# Patient Record
Sex: Male | Born: 1955 | Race: White | Hispanic: No | Marital: Single | Smoking: Never smoker
Health system: Southern US, Community
[De-identification: ages and names within clinical notes are randomized; demographics above are authoritative.]

## PROBLEM LIST (undated history)

## (undated) DIAGNOSIS — M797 Fibromyalgia: Secondary | ICD-10-CM

## (undated) HISTORY — PX: HERNIA REPAIR: SHX51

## (undated) HISTORY — PX: TONSILLECTOMY: SUR1361

## (undated) HISTORY — PX: CHOLECYSTECTOMY: SHX55

---

## 2008-11-07 ENCOUNTER — Encounter: Admission: RE | Admit: 2008-11-07 | Discharge: 2008-11-07 | Payer: Self-pay | Admitting: Otolaryngology

## 2010-03-18 ENCOUNTER — Encounter: Admission: RE | Admit: 2010-03-18 | Discharge: 2010-03-18 | Payer: Self-pay | Admitting: Otolaryngology

## 2010-10-18 ENCOUNTER — Encounter (INDEPENDENT_AMBULATORY_CARE_PROVIDER_SITE_OTHER): Payer: Self-pay | Admitting: *Deleted

## 2010-10-18 DIAGNOSIS — H9209 Otalgia, unspecified ear: Secondary | ICD-10-CM | POA: Insufficient documentation

## 2010-10-23 ENCOUNTER — Ambulatory Visit
Admission: RE | Admit: 2010-10-23 | Discharge: 2010-10-23 | Payer: Self-pay | Source: Home / Self Care | Attending: Infectious Diseases | Admitting: Infectious Diseases

## 2010-10-23 ENCOUNTER — Encounter: Payer: Self-pay | Admitting: Infectious Diseases

## 2010-10-23 DIAGNOSIS — Z87442 Personal history of urinary calculi: Secondary | ICD-10-CM | POA: Insufficient documentation

## 2010-10-23 DIAGNOSIS — I10 Essential (primary) hypertension: Secondary | ICD-10-CM | POA: Insufficient documentation

## 2010-10-23 DIAGNOSIS — E559 Vitamin D deficiency, unspecified: Secondary | ICD-10-CM | POA: Insufficient documentation

## 2010-10-23 DIAGNOSIS — IMO0001 Reserved for inherently not codable concepts without codable children: Secondary | ICD-10-CM | POA: Insufficient documentation

## 2010-10-23 DIAGNOSIS — G4733 Obstructive sleep apnea (adult) (pediatric): Secondary | ICD-10-CM | POA: Insufficient documentation

## 2010-10-23 DIAGNOSIS — E785 Hyperlipidemia, unspecified: Secondary | ICD-10-CM | POA: Insufficient documentation

## 2010-10-23 LAB — CONVERTED CEMR LAB: HIV: NONREACTIVE

## 2010-11-14 NOTE — Consult Note (Signed)
Summary: New Pt. Referral: Family Medicine  New Pt. Referral: Family Medicine   Imported By: Florinda Marker 10/28/2010 11:44:35  _____________________________________________________________________  External Attachment:    Type:   Image     Comment:   External Document

## 2010-11-14 NOTE — Miscellaneous (Signed)
Summary: HIPAA Restrictions  HIPAA Restrictions   Imported By: Florinda Marker 10/23/2010 15:09:19  _____________________________________________________________________  External Attachment:    Type:   Image     Comment:   External Document

## 2010-11-14 NOTE — Assessment & Plan Note (Signed)
Summary: new pt ear inf/pt needs early morning/works 11:00a-7:00p   CC:  right side of [place] and ear x 1 year.  History of Present Illness: 55 yo M with hx of R ear pain x 1 year. He has been treated with antibiotics which seem to help transiently (zithromax, cipro, levaquin). he has had a mri of the [place] september 2010 (nl) as well as a MRI of the neck 6-6 -2011 (nl).  His ear does feel swollen and hot during these episodes, no d/c from his ear, has itching from this area during these periods as well. He also has dizziness and intermittent hearing loss. Has had low grade fever, chills and night sweats.  He has a history of headache as well, these get worse with taking Vitamin D.    Preventive Screening-Counseling & Management  Alcohol-Tobacco     Alcohol drinks/day: 0     Smoking Status: never  Caffeine-Diet-Exercise     Caffeine use/day: yes   Updated Prior Medication List: CLONIDINE HCL 0.2 MG TABS (CLONIDINE HCL) Take one tablet orally twice a day ALLEGRA ALLERGY 180 MG TABS (FEXOFENADINE HCL) Take 1 tablet by mouth once a day SINGULAIR 10 MG TABS (MONTELUKAST SODIUM) Take 1 tablet by mouth once a day BYSTOLIC 20 MG TABS (NEBIVOLOL HCL) Take 1 tablet by mouth once a day AZOR 10-40 MG TABS (AMLODIPINE-OLMESARTAN) Take 1 tablet by mouth once a day  Current Allergies (reviewed today): ! LIPITOR ! 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 HYPERTENSION (ICD-401.9) EAR PAIN, RIGHT (ICD-388.70) Hyperlipidemia Nephrolithiasis, hx of OSA- prev palatoplasty  Family History: Family History Hypertension diabetes, breast CA, grandfather- alcoholism  Social History: Single Never Smoked Alcohol use-no  Review of Systems       wt has gradually increased, has had submandibular tenderness,   Vital Signs:  Patient profile:   55 year old male Height:      72 inches (182.88  cm) Weight:      251.75 pounds (114.43 kg) BMI:     34.27 Temp:     98.2 degrees F (36.78 degrees C) oral Pulse rate:   71 / minute BP sitting:   143 / 87  (left arm) Cuff size:   large  Vitals Entered By: Jennet Maduro RN (October 23, 2010 9:11 AM) CC: right side of [place], ear x 1 year Is Patient Diabetic? No Pain Assessment Patient in pain? yes     Location: right ear Intensity: 3 Type: aching, throbbing Onset of pain  Constant Nutritional Status BMI of > 30 = obese Nutritional Status Detail appetite "pretty good"  Have you ever been in a relationship where you felt threatened, hurt or afraid?not asked today   Does patient need assistance? Functional Status Self care Ambulation Normal   Physical Exam  General:  well-developed, well-nourished, and well-hydrated.   Eyes:  pupils equal, pupils round, and pupils reactive to light.   Ears:  R ear normal (he has tenderness to touch in his canal) and L ear normal.   Mouth:  pharynx pink and moist and no exudates.   Neck:  no masses.   Lungs:  normal respiratory effort and normal breath sounds.   Heart:  normal rate, regular rhythm, and no murmur.   Abdomen:  soft, non-tender, and normal bowel sounds.     Impression & Recommendations:  Problem # 1:  EAR PAIN, RIGHT (ICD-388.70)  Very odd case with limited options for  infectious causes- his exam does not suggest OM or otitis externa. A consideration for this syndrome would be Bell's Palsy/Ramsey Hunt. This syndrome would certainly give him recurrent episodes of pain and swelling as well as erythema. The duration of his episodes would correpnd to the duration of his .anbx  (ie taking the anbx correpsonds to the natural history of the resolution of the episode).  He has not had a response to lyrica (i mentioned amitryptiline and neurontin which he states he has taken before). Treatment would be difficult and would be giving him medications to which he is already not responding to  (amitryptiline, neurontin, lyrica). Will check his vzv and HSV antibody levels to see if this supports the diagnosis. He may need EMG to further assist with the dx. will see him back in 2-3 weeks.   Orders: Consultation Level IV [id number]) T- * Misc. Laboratory test [phone]) T-HIV Antibody  (Reflex) [phone])  Medications Added to Medication List This Visit: 1)  Allegra Allergy 180 Mg Tabs (Fexofenadine hcl) .... Take 1 tablet by mouth once a day 2)  Singulair 10 Mg Tabs (Montelukast sodium) .... Take 1 tablet by mouth once a day 3)  Bystolic 20 Mg Tabs (Nebivolol hcl) .... Take 1 tablet by mouth once a day 4)  Azor 10-40 Mg Tabs (Amlodipine-olmesartan) .... Take 1 tablet by mouth once a day

## 2010-11-14 NOTE — Miscellaneous (Signed)
Summary: clinical update  Clinical Lists Changes  Problems: Added new problem of EAR PAIN, RIGHT (ICD-388.70) Medications: Added new medication of CLONIDINE HCL 0.2 MG TABS (CLONIDINE HCL) Take one tablet orally twice a day Allergies: Added new allergy or adverse reaction of LIPITOR Added new allergy or adverse reaction of SULFA Observations: Added new observation of NKA: F (10/18/2010 16:38)

## 2010-11-26 ENCOUNTER — Encounter: Payer: Self-pay | Admitting: Infectious Diseases

## 2010-12-04 NOTE — Miscellaneous (Signed)
Summary: Release to Pt.  Release to Pt.   Imported By: Florinda Marker 11/26/2010 18:05:41  _____________________________________________________________________  External Attachment:    Type:   Image     Comment:   External Document

## 2010-12-24 ENCOUNTER — Encounter: Payer: Self-pay | Admitting: *Deleted

## 2017-06-09 DIAGNOSIS — I10 Essential (primary) hypertension: Secondary | ICD-10-CM | POA: Diagnosis not present

## 2017-06-09 DIAGNOSIS — J309 Allergic rhinitis, unspecified: Secondary | ICD-10-CM | POA: Diagnosis not present

## 2017-06-09 DIAGNOSIS — E559 Vitamin D deficiency, unspecified: Secondary | ICD-10-CM | POA: Diagnosis not present

## 2017-06-09 DIAGNOSIS — M797 Fibromyalgia: Secondary | ICD-10-CM | POA: Diagnosis not present

## 2017-11-03 DIAGNOSIS — X32XXXA Exposure to sunlight, initial encounter: Secondary | ICD-10-CM | POA: Diagnosis not present

## 2017-11-03 DIAGNOSIS — D2239 Melanocytic nevi of other parts of face: Secondary | ICD-10-CM | POA: Diagnosis not present

## 2017-11-03 DIAGNOSIS — L700 Acne vulgaris: Secondary | ICD-10-CM | POA: Diagnosis not present

## 2017-11-03 DIAGNOSIS — L570 Actinic keratosis: Secondary | ICD-10-CM | POA: Diagnosis not present

## 2017-11-03 DIAGNOSIS — L738 Other specified follicular disorders: Secondary | ICD-10-CM | POA: Diagnosis not present

## 2017-11-24 DIAGNOSIS — Z1159 Encounter for screening for other viral diseases: Secondary | ICD-10-CM | POA: Diagnosis not present

## 2017-11-24 DIAGNOSIS — Z0000 Encounter for general adult medical examination without abnormal findings: Secondary | ICD-10-CM | POA: Diagnosis not present

## 2017-11-24 DIAGNOSIS — J309 Allergic rhinitis, unspecified: Secondary | ICD-10-CM | POA: Diagnosis not present

## 2017-11-24 DIAGNOSIS — E559 Vitamin D deficiency, unspecified: Secondary | ICD-10-CM | POA: Diagnosis not present

## 2017-11-24 DIAGNOSIS — E782 Mixed hyperlipidemia: Secondary | ICD-10-CM | POA: Diagnosis not present

## 2017-11-24 DIAGNOSIS — Z125 Encounter for screening for malignant neoplasm of prostate: Secondary | ICD-10-CM | POA: Diagnosis not present

## 2017-11-24 DIAGNOSIS — M797 Fibromyalgia: Secondary | ICD-10-CM | POA: Diagnosis not present

## 2017-11-24 DIAGNOSIS — I10 Essential (primary) hypertension: Secondary | ICD-10-CM | POA: Diagnosis not present

## 2018-05-27 DIAGNOSIS — E782 Mixed hyperlipidemia: Secondary | ICD-10-CM | POA: Diagnosis not present

## 2018-05-27 DIAGNOSIS — I10 Essential (primary) hypertension: Secondary | ICD-10-CM | POA: Diagnosis not present

## 2018-05-27 DIAGNOSIS — M797 Fibromyalgia: Secondary | ICD-10-CM | POA: Diagnosis not present

## 2018-05-27 DIAGNOSIS — E559 Vitamin D deficiency, unspecified: Secondary | ICD-10-CM | POA: Diagnosis not present

## 2018-05-27 DIAGNOSIS — J309 Allergic rhinitis, unspecified: Secondary | ICD-10-CM | POA: Diagnosis not present

## 2018-06-22 DIAGNOSIS — Z23 Encounter for immunization: Secondary | ICD-10-CM | POA: Diagnosis not present

## 2018-06-23 DIAGNOSIS — L570 Actinic keratosis: Secondary | ICD-10-CM | POA: Diagnosis not present

## 2018-06-23 DIAGNOSIS — L603 Nail dystrophy: Secondary | ICD-10-CM | POA: Diagnosis not present

## 2018-06-23 DIAGNOSIS — X32XXXD Exposure to sunlight, subsequent encounter: Secondary | ICD-10-CM | POA: Diagnosis not present

## 2018-12-30 DIAGNOSIS — J309 Allergic rhinitis, unspecified: Secondary | ICD-10-CM | POA: Diagnosis not present

## 2018-12-30 DIAGNOSIS — E559 Vitamin D deficiency, unspecified: Secondary | ICD-10-CM | POA: Diagnosis not present

## 2018-12-30 DIAGNOSIS — I10 Essential (primary) hypertension: Secondary | ICD-10-CM | POA: Diagnosis not present

## 2018-12-30 DIAGNOSIS — M797 Fibromyalgia: Secondary | ICD-10-CM | POA: Diagnosis not present

## 2018-12-30 DIAGNOSIS — Z125 Encounter for screening for malignant neoplasm of prostate: Secondary | ICD-10-CM | POA: Diagnosis not present

## 2018-12-30 DIAGNOSIS — E782 Mixed hyperlipidemia: Secondary | ICD-10-CM | POA: Diagnosis not present

## 2019-06-25 DIAGNOSIS — Z23 Encounter for immunization: Secondary | ICD-10-CM | POA: Diagnosis not present

## 2019-06-30 DIAGNOSIS — J309 Allergic rhinitis, unspecified: Secondary | ICD-10-CM | POA: Diagnosis not present

## 2019-06-30 DIAGNOSIS — M797 Fibromyalgia: Secondary | ICD-10-CM | POA: Diagnosis not present

## 2019-06-30 DIAGNOSIS — E782 Mixed hyperlipidemia: Secondary | ICD-10-CM | POA: Diagnosis not present

## 2019-06-30 DIAGNOSIS — I10 Essential (primary) hypertension: Secondary | ICD-10-CM | POA: Diagnosis not present

## 2019-12-08 DIAGNOSIS — L720 Epidermal cyst: Secondary | ICD-10-CM | POA: Diagnosis not present

## 2019-12-08 DIAGNOSIS — L700 Acne vulgaris: Secondary | ICD-10-CM | POA: Diagnosis not present

## 2019-12-08 DIAGNOSIS — D485 Neoplasm of uncertain behavior of skin: Secondary | ICD-10-CM | POA: Diagnosis not present

## 2020-02-09 DIAGNOSIS — E782 Mixed hyperlipidemia: Secondary | ICD-10-CM | POA: Diagnosis not present

## 2020-02-09 DIAGNOSIS — E559 Vitamin D deficiency, unspecified: Secondary | ICD-10-CM | POA: Diagnosis not present

## 2020-02-09 DIAGNOSIS — I10 Essential (primary) hypertension: Secondary | ICD-10-CM | POA: Diagnosis not present

## 2020-02-09 DIAGNOSIS — M797 Fibromyalgia: Secondary | ICD-10-CM | POA: Diagnosis not present

## 2020-02-09 DIAGNOSIS — J309 Allergic rhinitis, unspecified: Secondary | ICD-10-CM | POA: Diagnosis not present

## 2020-03-08 DIAGNOSIS — L700 Acne vulgaris: Secondary | ICD-10-CM | POA: Diagnosis not present

## 2020-03-08 DIAGNOSIS — L821 Other seborrheic keratosis: Secondary | ICD-10-CM | POA: Diagnosis not present

## 2020-06-21 DIAGNOSIS — L720 Epidermal cyst: Secondary | ICD-10-CM | POA: Diagnosis not present

## 2020-06-21 DIAGNOSIS — L738 Other specified follicular disorders: Secondary | ICD-10-CM | POA: Diagnosis not present

## 2020-06-21 DIAGNOSIS — L723 Sebaceous cyst: Secondary | ICD-10-CM | POA: Diagnosis not present

## 2020-06-21 DIAGNOSIS — D1801 Hemangioma of skin and subcutaneous tissue: Secondary | ICD-10-CM | POA: Diagnosis not present

## 2020-07-02 DIAGNOSIS — Z23 Encounter for immunization: Secondary | ICD-10-CM | POA: Diagnosis not present

## 2020-08-09 DIAGNOSIS — E559 Vitamin D deficiency, unspecified: Secondary | ICD-10-CM | POA: Diagnosis not present

## 2020-08-09 DIAGNOSIS — M797 Fibromyalgia: Secondary | ICD-10-CM | POA: Diagnosis not present

## 2020-08-09 DIAGNOSIS — I10 Essential (primary) hypertension: Secondary | ICD-10-CM | POA: Diagnosis not present

## 2020-08-09 DIAGNOSIS — Z0000 Encounter for general adult medical examination without abnormal findings: Secondary | ICD-10-CM | POA: Diagnosis not present

## 2020-08-09 DIAGNOSIS — Z125 Encounter for screening for malignant neoplasm of prostate: Secondary | ICD-10-CM | POA: Diagnosis not present

## 2020-08-09 DIAGNOSIS — J309 Allergic rhinitis, unspecified: Secondary | ICD-10-CM | POA: Diagnosis not present

## 2020-08-09 DIAGNOSIS — E782 Mixed hyperlipidemia: Secondary | ICD-10-CM | POA: Diagnosis not present

## 2021-07-05 ENCOUNTER — Other Ambulatory Visit: Payer: Self-pay

## 2021-07-05 ENCOUNTER — Encounter (HOSPITAL_COMMUNITY): Payer: Self-pay

## 2021-07-05 ENCOUNTER — Emergency Department (HOSPITAL_COMMUNITY): Payer: 59

## 2021-07-05 ENCOUNTER — Emergency Department (HOSPITAL_COMMUNITY)
Admission: EM | Admit: 2021-07-05 | Discharge: 2021-07-06 | Disposition: A | Discharge status: Home / Self Care | Payer: 59 | Attending: Emergency Medicine | Admitting: Emergency Medicine

## 2021-07-05 DIAGNOSIS — Z79899 Other long term (current) drug therapy: Secondary | ICD-10-CM | POA: Insufficient documentation

## 2021-07-05 DIAGNOSIS — R21 Rash and other nonspecific skin eruption: Secondary | ICD-10-CM | POA: Insufficient documentation

## 2021-07-05 DIAGNOSIS — R1031 Right lower quadrant pain: Secondary | ICD-10-CM | POA: Diagnosis not present

## 2021-07-05 DIAGNOSIS — I10 Essential (primary) hypertension: Secondary | ICD-10-CM | POA: Diagnosis not present

## 2021-07-05 DIAGNOSIS — R1013 Epigastric pain: Secondary | ICD-10-CM | POA: Diagnosis not present

## 2021-07-05 DIAGNOSIS — R110 Nausea: Secondary | ICD-10-CM

## 2021-07-05 HISTORY — DX: Fibromyalgia: M79.7

## 2021-07-05 LAB — COMPREHENSIVE METABOLIC PANEL
ALT: 26 U/L (ref 0–44)
AST: 27 U/L (ref 15–41)
Albumin: 4.4 g/dL (ref 3.5–5.0)
Alkaline Phosphatase: 118 U/L (ref 38–126)
Anion gap: 11 (ref 5–15)
BUN: 10 mg/dL (ref 8–23)
CO2: 24 mmol/L (ref 22–32)
Calcium: 9.5 mg/dL (ref 8.9–10.3)
Chloride: 103 mmol/L (ref 98–111)
Creatinine, Ser: 1.48 mg/dL — ABNORMAL HIGH (ref 0.61–1.24)
GFR, Estimated: 53 mL/min — ABNORMAL LOW (ref >60)
Glucose, Bld: 114 mg/dL — ABNORMAL HIGH (ref 70–99)
Potassium: 4.5 mmol/L (ref 3.5–5.1)
Sodium: 138 mmol/L (ref 135–145)
Total Bilirubin: 0.6 mg/dL (ref 0.3–1.2)
Total Protein: 7.4 g/dL (ref 6.5–8.1)

## 2021-07-05 LAB — CBC WITH DIFFERENTIAL/PLATELET
Abs Immature Granulocytes: 0.01 10*3/uL (ref 0.00–0.07)
Basophils Absolute: 0.1 10*3/uL (ref 0.0–0.1)
Basophils Relative: 1 %
Eosinophils Absolute: 0.1 10*3/uL (ref 0.0–0.5)
Eosinophils Relative: 1 %
HCT: 47.5 % (ref 39.0–52.0)
Hemoglobin: 16.4 g/dL (ref 13.0–17.0)
Immature Granulocytes: 0 %
Lymphocytes Relative: 24 %
Lymphs Abs: 1.7 10*3/uL (ref 0.7–4.0)
MCH: 32.1 pg (ref 26.0–34.0)
MCHC: 34.5 g/dL (ref 30.0–36.0)
MCV: 93 fL (ref 80.0–100.0)
Monocytes Absolute: 0.6 10*3/uL (ref 0.1–1.0)
Monocytes Relative: 8 %
Neutro Abs: 4.6 10*3/uL (ref 1.7–7.7)
Neutrophils Relative %: 66 %
Platelets: 261 10*3/uL (ref 150–400)
RBC: 5.11 MIL/uL (ref 4.22–5.81)
RDW: 11.8 % (ref 11.5–15.5)
WBC: 7 10*3/uL (ref 4.0–10.5)
nRBC: 0 % (ref 0.0–0.2)

## 2021-07-05 LAB — LIPASE, BLOOD: Lipase: 25 U/L (ref 11–51)

## 2021-07-05 MED ORDER — IOHEXOL 350 MG/ML SOLN
100.0000 mL | Freq: Once | INTRAVENOUS | Status: AC | PRN
  Administered 2021-07-05: 100 mL via INTRAVENOUS

## 2021-07-05 NOTE — ED Provider Notes (Signed)
Emergency Medicine Provider Triage Evaluation Note  George Neal , a 65 y.o. male  was evaluated in triage.  Pt complains of abdominal pain, with more focal pain in the right lower quadrant which is been ongoing for the last 2 days.  Was seen at urgent care and had more pronounced right lower quadrant pain on exam and was sent to the ER for evaluation.  He does still have his appendix.  History of cholecystectomy and multiple hernia repairs.  Has had poor p.o. intake over the last several days to nausea but no vomiting.  Denies any urinary symptoms.  Last bowel movement was yesterday. Review of Systems  Positive: As above Negative: As above  Physical Exam  BP (!) 142/99 (BP Location: Left Arm)   Pulse 79   Temp 99.5 F (37.5 C) (Oral)   Resp 16   Ht 6' (1.829 m)   Wt 106.1 kg   SpO2 100%   BMI 31.74 kg/m  Gen:   Awake, no distress   Resp:  Normal effort  MSK:   Moves extremities without difficulty  Other:  Generalized abdominal tenderness with pronounced McBurney's.  No CVA tenderness.  Medical Decision Making  Medically screening exam initiated at 3:37 PM.  Appropriate orders placed.  George Neal was informed that the remainder of the evaluation will be completed by another provider, this initial triage assessment does not replace that evaluation, and the importance of remaining in the ED until their evaluation is complete.     Mare Ferrari, PA-C 07/05/21 1539    Franne Forts, DO 07/07/21 1155

## 2021-07-05 NOTE — ED Notes (Signed)
ED Provider at bedside. 

## 2021-07-05 NOTE — ED Triage Notes (Signed)
Pt reports generalized abd pain but more pain in his RLQ, nausea but no vomiting for the past 2 days. No urinary s/s.

## 2021-07-06 LAB — URINALYSIS, ROUTINE W REFLEX MICROSCOPIC
Bacteria, UA: NONE SEEN
Bilirubin Urine: NEGATIVE
Glucose, UA: NEGATIVE mg/dL
Hgb urine dipstick: NEGATIVE
Ketones, ur: 5 mg/dL — AB
Leukocytes,Ua: NEGATIVE
Nitrite: NEGATIVE
Protein, ur: NEGATIVE mg/dL
Specific Gravity, Urine: >1.046 — ABNORMAL HIGH (ref 1.005–1.030)
pH: 6 (ref 5.0–8.0)

## 2021-07-06 MED ORDER — HYDROMORPHONE HCL 1 MG/ML IJ SOLN
1.0000 mg | Freq: Once | INTRAMUSCULAR | Status: DC
  Filled 2021-07-06: qty 1

## 2021-07-06 MED ORDER — GABAPENTIN 300 MG PO CAPS
300.0000 mg | ORAL_CAPSULE | Freq: Once | ORAL | Status: AC
  Administered 2021-07-06: 300 mg via ORAL
  Filled 2021-07-06: qty 1

## 2021-07-06 MED ORDER — ALUM & MAG HYDROXIDE-SIMETH 200-200-20 MG/5ML PO SUSP
30.0000 mL | Freq: Once | ORAL | Status: AC
  Administered 2021-07-06: 30 mL via ORAL
  Filled 2021-07-06: qty 30

## 2021-07-06 MED ORDER — ONDANSETRON HCL 4 MG/2ML IJ SOLN
4.0000 mg | Freq: Once | INTRAMUSCULAR | Status: AC
Start: 2021-07-06 — End: 2021-07-06
  Administered 2021-07-06: 4 mg via INTRAVENOUS
  Filled 2021-07-06: qty 2

## 2021-07-06 MED ORDER — LACTATED RINGERS IV BOLUS
1000.0000 mL | Freq: Once | INTRAVENOUS | Status: AC
  Administered 2021-07-06: 1000 mL via INTRAVENOUS

## 2021-07-06 MED ORDER — ONDANSETRON 4 MG PO TBDP: ORAL_TABLET | ORAL | 0 refills | Status: AC

## 2021-07-06 MED ORDER — TRAMADOL HCL 50 MG PO TABS
50.0000 mg | ORAL_TABLET | Freq: Once | ORAL | Status: AC
Start: 2021-07-06 — End: 2021-07-06
  Administered 2021-07-06: 50 mg via ORAL
  Filled 2021-07-06: qty 1

## 2021-07-06 MED ORDER — LIDOCAINE VISCOUS HCL 2 % MT SOLN
15.0000 mL | Freq: Once | OROMUCOSAL | Status: AC
  Administered 2021-07-06: 15 mL via ORAL
  Filled 2021-07-06: qty 15

## 2021-07-06 NOTE — ED Provider Notes (Signed)
[facility] EMERGENCY DEPARTMENT Provider Note   CSN: [id number] Arrival date & time: 07/05/21  1519     History Chief Complaint  Patient presents with   Abdominal Pain    George Neal is a 65 y.o. male.  64 year old male who presents emerged from today with abdominal pain.  Patient states that he had some epigastric pain and nausea starting Thursday morning when he woke up.  He states he has not vomited but just the thought of food makes him nauseous and he has not tried to eat or drink much since then.  Patient went to urgent care today where they noted tenderness in his right lower quadrant so sent him here for further evaluation.  Patient states that with his fibromyalgia he often has sweating and elevated temperatures but has not had anything more than that today.  Patient is very upset about having to wait so long and misses nighttime medications which include Neurontin and tramadol.  States he had no suspicious food intake, no diarrhea, decreased urine output today.  No sick contacts.   Abdominal Pain     Past Medical History:  Diagnosis Date   Fibromyalgia     Patient Active Problem List   Diagnosis Date Noted   VITAMIN D DEFICIENCY 10/23/2010   HYPERLIPIDEMIA 10/23/2010   OBSTRUCTIVE SLEEP APNEA 10/23/2010   HYPERTENSION 10/23/2010   MYOFASCIAL PAIN SYNDROME 10/23/2010   NEPHROLITHIASIS, HX OF 10/23/2010   EAR PAIN, RIGHT 10/18/2010    Past Surgical History:  Procedure Laterality Date   CHOLECYSTECTOMY         No family history on file.     Home Medications Prior to Admission medications   Medication Sig Start Date End Date Taking? Authorizing Provider  ondansetron (ZOFRAN ODT) 4 MG disintegrating tablet 4mg  ODT q4 hours prn nausea/vomit 07/06/21  Yes Keyunna Coco, 07/08/21, MD  amLODipine-olmesartan (AZOR) 10-40 MG per tablet Take 1 tablet by mouth daily.      [provider]  cloNIDine (CATAPRES) 0.2 MG tablet Take 0.2 mg by mouth 2  (two) times daily.      [provider]  fexofenadine (ALLEGRA) 180 MG tablet Take 180 mg by mouth daily.      [provider]  montelukast (SINGULAIR) 10 MG tablet Take 10 mg by mouth daily.      [provider]  Nebivolol HCl (BYSTOLIC) 20 MG TABS Take 1 tablet by mouth daily.      [provider]    Allergies    Atorvastatin and Sulfonamide derivatives  Review of Systems   Review of Systems  Gastrointestinal:  Positive for abdominal pain.  All other systems reviewed and are negative.  Physical Exam Updated Vital Signs BP (!) 146/86 (BP Location: Right Arm)   Pulse 86   Temp 99 F (37.2 C) (Oral)   Resp 16   Ht 6' (1.829 m)   Wt 106.1 kg   SpO2 99%   BMI 31.74 kg/m   Physical Exam Vitals and nursing note reviewed.  Constitutional:      Appearance: He is well-developed.  HENT:     Belitz: Normocephalic and atraumatic.  Cardiovascular:     Rate and Rhythm: Normal rate.  Pulmonary:     Effort: Pulmonary effort is normal. No respiratory distress.  Abdominal:     General: There is no distension.     Tenderness: There is abdominal tenderness in the right lower quadrant and periumbilical area.     Hernia: No  hernia is present.  Musculoskeletal:        General: Normal range of motion.     Cervical back: Normal range of motion.  Skin:    General: Skin is warm and dry.     Findings: Rash (eczematous appearing rash on abdomen) present.  Neurological:     General: No focal deficit present.     Mental Status: He is alert.    ED Results / Procedures / Treatments   Labs (all labs ordered are listed, but only abnormal results are displayed) Labs Reviewed  COMPREHENSIVE METABOLIC PANEL - Abnormal; Notable for the following components:      Result Value   Glucose, Bld 114 (*)    Creatinine, Ser 1.48 (*)    GFR, Estimated 53 (*)    All other components within normal limits  URINALYSIS, ROUTINE W REFLEX MICROSCOPIC - Abnormal; Notable for  the following components:   Specific Gravity, Urine >1.046 (*)    Ketones, ur 5 (*)    All other components within normal limits  CBC WITH DIFFERENTIAL/PLATELET  LIPASE, BLOOD    EKG None  Radiology CT ABDOMEN PELVIS W CONTRAST  Result Date: 07/05/2021 CLINICAL DATA:  RLQ abdominal pain EXAM: CT ABDOMEN AND PELVIS WITH CONTRAST TECHNIQUE: Multidetector CT imaging of the abdomen and pelvis was performed using the standard protocol following bolus administration of intravenous contrast. CONTRAST:  OMNIPAQUE IOHEXOL 350 MG/ML SOLN COMPARISON:  None. FINDINGS: Lower chest: No acute abnormality.  Gynecomastia, mild Hepatobiliary: Hepatic steatosis. There is a 22 mm RIGHT hepatic cyst. Status post cholecystectomy. No intrahepatic or extrahepatic biliary ductal dilation. Portal vein is patent. Pancreas: Unremarkable. No pancreatic ductal dilatation or surrounding inflammatory changes. Spleen: Normal in size without focal abnormality. Adrenals/Urinary Tract: Adrenal glands are unremarkable. No hydronephrosis. Subcentimeter hypodense lesions are too small to accurately characterize. No obstructing nephrolithiasis. Bladder is decompressed. Stomach/Bowel: Stomach is within normal limits. Appendix appears normal. No evidence of bowel wall thickening, distention, or acute inflammatory changes. Vascular/Lymphatic: Atherosclerotic calcifications of the aorta. No suspicious lymphadenopathy. Reproductive: Prostate is present. Other: No abdominal wall hernia or abnormality. No abdominopelvic ascites. Musculoskeletal: No acute or significant osseous findings. IMPRESSION: 1. No CT etiology for acute abdominal pain identified. 2. Hepatic steatosis. Aortic Atherosclerosis (ICD10-I70.0). Electronically Signed   By: Meda Klinefelter M.D.   On: 07/05/2021 19:00    Procedures Procedures   Medications Ordered in ED Medications  iohexol (OMNIPAQUE) 350 MG/ML injection 100 mL (100 mLs Intravenous Contrast Given  07/05/21 1802)  gabapentin (NEURONTIN) capsule 300 mg (300 mg Oral Given 07/06/21 0117)  lactated ringers bolus 1,000 mL (0 mLs Intravenous Stopped 07/06/21 0256)  ondansetron (ZOFRAN) injection 4 mg (4 mg Intravenous Given 07/06/21 0111)  alum & mag hydroxide-simeth (MAALOX/MYLANTA) 200-200-20 MG/5ML suspension 30 mL (30 mLs Oral Given 07/06/21 0149)    And  lidocaine (XYLOCAINE) 2 % viscous mouth solution 15 mL (15 mLs Oral Given 07/06/21 0149)  traMADol (ULTRAM) tablet 50 mg (50 mg Oral Given 07/06/21 0148)  lactated ringers bolus 1,000 mL (0 mLs Intravenous Stopped 07/06/21 0410)    ED Course  I have reviewed the triage vital signs and the nursing notes.  Pertinent labs & imaging results that were available during my care of the patient were reviewed by me and considered in my medical decision making (see chart for details).    MDM Rules/Calculators/A&P  Patient very upset with me upon my entering the room.  Patient asking for another physician but there is no other providers here to provide care.  He states that I am a smart ass and this is why does not want me his doctor.  Ultimately he did acquiesce and start giving me history as documented above.  I suspect that this is probably from having to wait so long along with not take his medications and being uncomfortable and hungry.  As there is no obvious abnormalities on CT scan of the rest of his work-up.  Still waiting for urine.  We will go and start treat symptomatically and give him some fluids.  We will let him eat and drink when his time.  He does seem to be little bit tender in his right lower quadrant/right periumbilical area there is no evidence of appendicitis on the CT scan.  He has a gallbladder taken out already.  He does have eczematous rash but he states that is normal and does not actually painful.  It is unclear what the etiology of symptoms are but at this point it seems to be more symptom control than  anything.  Symptoms improved. Dc.   Final Clinical Impression(s) / ED Diagnoses Final diagnoses:  Right lower quadrant abdominal pain  Nausea    Rx / DC Orders ED Discharge Orders          Ordered    ondansetron (ZOFRAN ODT) 4 MG disintegrating tablet        07/06/21 0418             Maloni Musleh, Barbara Cower, MD 07/07/21 0111

## 2022-02-28 DIAGNOSIS — E782 Mixed hyperlipidemia: Secondary | ICD-10-CM | POA: Diagnosis not present

## 2022-02-28 DIAGNOSIS — I10 Essential (primary) hypertension: Secondary | ICD-10-CM | POA: Diagnosis not present

## 2022-02-28 DIAGNOSIS — M797 Fibromyalgia: Secondary | ICD-10-CM | POA: Diagnosis not present

## 2022-02-28 DIAGNOSIS — J309 Allergic rhinitis, unspecified: Secondary | ICD-10-CM | POA: Diagnosis not present

## 2022-04-09 DIAGNOSIS — J019 Acute sinusitis, unspecified: Secondary | ICD-10-CM | POA: Diagnosis not present

## 2022-04-09 DIAGNOSIS — Z03818 Encounter for observation for suspected exposure to other biological agents ruled out: Secondary | ICD-10-CM | POA: Diagnosis not present

## 2022-05-16 DIAGNOSIS — I10 Essential (primary) hypertension: Secondary | ICD-10-CM | POA: Diagnosis not present

## 2022-05-16 DIAGNOSIS — M797 Fibromyalgia: Secondary | ICD-10-CM | POA: Diagnosis not present

## 2022-05-16 DIAGNOSIS — R4189 Other symptoms and signs involving cognitive functions and awareness: Secondary | ICD-10-CM | POA: Diagnosis not present

## 2023-05-01 DIAGNOSIS — I10 Essential (primary) hypertension: Secondary | ICD-10-CM | POA: Diagnosis not present

## 2023-05-01 DIAGNOSIS — E782 Mixed hyperlipidemia: Secondary | ICD-10-CM | POA: Diagnosis not present

## 2023-05-01 DIAGNOSIS — M797 Fibromyalgia: Secondary | ICD-10-CM | POA: Diagnosis not present

## 2023-05-01 DIAGNOSIS — Z125 Encounter for screening for malignant neoplasm of prostate: Secondary | ICD-10-CM | POA: Diagnosis not present

## 2023-05-01 DIAGNOSIS — L719 Rosacea, unspecified: Secondary | ICD-10-CM | POA: Diagnosis not present

## 2023-05-01 DIAGNOSIS — Z23 Encounter for immunization: Secondary | ICD-10-CM | POA: Diagnosis not present

## 2023-05-01 DIAGNOSIS — J309 Allergic rhinitis, unspecified: Secondary | ICD-10-CM | POA: Diagnosis not present

## 2023-05-01 DIAGNOSIS — R9431 Abnormal electrocardiogram [ECG] [EKG]: Secondary | ICD-10-CM | POA: Diagnosis not present

## 2023-05-01 DIAGNOSIS — Z0000 Encounter for general adult medical examination without abnormal findings: Secondary | ICD-10-CM | POA: Diagnosis not present

## 2023-05-01 DIAGNOSIS — E559 Vitamin D deficiency, unspecified: Secondary | ICD-10-CM | POA: Diagnosis not present

## 2023-06-02 ENCOUNTER — Ambulatory Visit: Payer: Medicare HMO | Admitting: Cardiology

## 2023-06-02 ENCOUNTER — Encounter: Payer: Self-pay | Admitting: Cardiology

## 2023-06-02 VITALS — BP 134/94 | HR 97 | Resp 17 | Ht 72.0 in | Wt 235.0 lb

## 2023-06-02 DIAGNOSIS — E781 Pure hyperglyceridemia: Secondary | ICD-10-CM | POA: Diagnosis not present

## 2023-06-02 DIAGNOSIS — E559 Vitamin D deficiency, unspecified: Secondary | ICD-10-CM

## 2023-06-02 DIAGNOSIS — R9431 Abnormal electrocardiogram [ECG] [EKG]: Secondary | ICD-10-CM

## 2023-06-02 DIAGNOSIS — Z789 Other specified health status: Secondary | ICD-10-CM | POA: Diagnosis not present

## 2023-06-02 DIAGNOSIS — I10 Essential (primary) hypertension: Secondary | ICD-10-CM

## 2023-06-02 DIAGNOSIS — K219 Gastro-esophageal reflux disease without esophagitis: Secondary | ICD-10-CM

## 2023-06-02 DIAGNOSIS — E782 Mixed hyperlipidemia: Secondary | ICD-10-CM

## 2023-06-02 NOTE — Progress Notes (Signed)
ID:  George Marseilles., DOB [birth year], MRN [id number]  PCP:  Ginnie Smart, MD  Cardiologist:  Tessa Lerner, DO, [facility] (established care 06/02/23)  REASON FOR CONSULT: Abnormal EKG   REQUESTING PHYSICIAN:  Ginnie Smart, MD [address] [place],  [place] [address]  Chief Complaint  Patient presents with   Abnormal ECG   New Patient (Initial Visit)    HPI  George Montreuil. is a 67 y.o. Caucasian male who presents to the clinic for evaluation of abnormal EKG at the request of Ginnie Smart, MD. His past medical history and cardiovascular risk factors include: Hypertension, fibromyalgia, hyperlipidemia, vitamin D deficiency, heartburn.  Patient is referred to the practice for evaluation of abnormal EKG.  Patient states that he recently enrolled into Medicare and had his first annual well visit and was noted to have an abnormal EKG.  Underlying rhythm is sinus but does have LVH with strain pattern.  Clinically denies anginal chest pain or heart failure symptoms.  He has fibromyalgia at baseline so he has chronic discomfort that is well-controlled.  He has history of hyperlipidemia and hypertriglyceridemia.  He is allergic to atorvastatin.  Vascepa is no longer covered by his insurance and it is cost prohibitive.  And he was started on fenofibrate around July 2024 per patient.  FUNCTIONAL STATUS: No structured exercise program or daily routine.   CARDIAC DATABASE: EKG: 06/02/2023: Sinus Tachycardia, LAD, LAFB, LVH, Nonspecific ST depression -Seen with left ventricular hypertrophy (strain).  Echocardiogram: No results found for this or any previous visit from the past 1095 days.   Stress Testing: No results found for this or any previous visit from the past 1095 days.   ALLERGIES: Allergies  Allergen Reactions   Atorvastatin    Sulfonamide Derivatives     REACTION: rash    MEDICATION LIST PRIOR TO VISIT: Current Meds  Medication Sig   amLODipine-olmesartan  (AZOR) 10-40 MG per tablet Take 1 tablet by mouth daily.     cholecalciferol (VITAMIN D3) 25 MCG (1000 UNIT) tablet Take 1,000 Units by mouth daily.   cloNIDine (CATAPRES) 0.2 MG tablet Take 0.2 mg by mouth 2 (two) times daily.     fenofibrate 160 MG tablet Take 160 mg by mouth daily.   fexofenadine (ALLEGRA) 180 MG tablet Take 180 mg by mouth daily.     gabapentin (NEURONTIN) 300 MG capsule Take 300 mg by mouth 2 (two) times daily.   metoprolol succinate (TOPROL-XL) 25 MG 24 hr tablet Take 25 mg by mouth daily.   montelukast (SINGULAIR) 10 MG tablet Take 10 mg by mouth daily.     Multiple Vitamin (MULTIVITAMIN) capsule Take 1 capsule by mouth daily.   Omega-3 Fatty Acids (FISH OIL) 1000 MG CAPS Take by mouth.   ondansetron (ZOFRAN ODT) 4 MG disintegrating tablet 4mg  ODT q4 hours prn nausea/vomit   traMADol (ULTRAM) 50 MG tablet Take 50 mg by mouth 2 (two) times daily as needed.     PAST MEDICAL HISTORY: Past Medical History:  Diagnosis Date   Fibromyalgia     PAST SURGICAL HISTORY: Past Surgical History:  Procedure Laterality Date   CHOLECYSTECTOMY     HERNIA REPAIR     TONSILLECTOMY      FAMILY HISTORY: The patient family history includes Hypertension in his mother; Kidney cancer in his sister; Kidney disease in his father.  SOCIAL HISTORY:  The patient  reports that he has never smoked. He has never used smokeless tobacco. He  reports that he does not currently use alcohol. He reports that he does not use drugs.  REVIEW OF SYSTEMS: Review of Systems  Cardiovascular:  Negative for chest pain, claudication, dyspnea on exertion, irregular heartbeat, leg swelling, near-syncope, orthopnea, palpitations, paroxysmal nocturnal dyspnea and syncope.  Respiratory:  Negative for shortness of breath.   Hematologic/Lymphatic: Negative for bleeding problem.  Musculoskeletal:  Negative for muscle cramps and myalgias.  Neurological:  Negative for dizziness and light-headedness.     PHYSICAL EXAM:    06/02/2023    8:36 AM 07/06/2021    2:55 AM 07/06/2021   12:30 AM  Vitals with BMI  Height 6\' 0"     Weight 235 lbs    BMI 31.86    Systolic 134 146 829  Diastolic 94 86 101  Pulse 97 86 82    Physical Exam  Constitutional: No distress.  Age appropriate, hemodynamically stable.   Neck: No JVD present.  Cardiovascular: Normal rate, regular rhythm, S1 normal, S2 normal, intact distal pulses and normal pulses. Exam reveals no gallop, no S3 and no S4.  No murmur heard. Pulmonary/Chest: Effort normal and breath sounds normal. No stridor. He has no wheezes. He has no rales.  Abdominal: Soft. Bowel sounds are normal. He exhibits no distension. There is no abdominal tenderness.  Musculoskeletal:        General: No edema.     Cervical back: Neck supple.  Neurological: He is alert and oriented to person, place, and time. He has intact cranial nerves (2-12).  Skin: Skin is warm and moist.   LABORATORY DATA: External Labs: Collected October 23, 2022 Total cholesterol 221, triglycerides 608, LDL 83, HDL 40, non-HDL 181   Collected: May 01, 2023 provided by PCP Total cholesterol 229, triglycerides 521, HDL 36, total cholesterol 562, non-HDL 194 TSH 2.3. BUN 11, creatinine 0.95. Potassium 4.3 AST 28, ALT 34, alkaline phosphatase 127. Hemoglobin 14.2  IMPRESSION:    ICD-10-CM   1. Abnormal EKG  R94.31 EKG 12-Lead    PCV ECHOCARDIOGRAM COMPLETE    CT CARDIAC SCORING (SELF PAY ONLY)    2. Essential hypertension  I10     3. Mixed hyperlipidemia  E78.2 CT CARDIAC SCORING (SELF PAY ONLY)    4. Hypertriglyceridemia, essential  E78.1 CT CARDIAC SCORING (SELF PAY ONLY)    5. Vitamin D deficiency  E55.9     6. Gastroesophageal reflux disease, unspecified whether esophagitis present  K21.9     7. Statin intolerance  Z78.9        RECOMMENDATIONS: George Zarn. is a 67 y.o. Caucasian male whose past medical history and cardiac risk factors include:  Hypertension, fibromyalgia, hyperlipidemia, vitamin D deficiency, heartburn.  Abnormal EKG Underlying rhythm is sinus with LVH per voltage criteria and ST-T changes likely secondary to repolarization abnormality. No prior EKGs available for comparison. Clinically denies anginal chest pain or heart failure symptoms. Echo will be ordered to evaluate for structural heart disease and left ventricular systolic function. Further recommendations forthcoming  Essential hypertension Home blood pressures are well-controlled-SBP around 130/82 Continue current medical therapy. Currently managed by primary care provider.  Mixed hyperlipidemia Statin intolerance Has tried Lipitor in the past but it caused significant tendon discomfort. Coronary calcium score for further risk stratification  Hypertriglyceridemia, essential Was on Vascepa in the past but discontinued due to it being cost prohibitive Started on fenofibrate by PCP in July 2024, per patient  Data Reviewed: I have independently reviewed external notes provided by the referring provider as part  of this office visit.   I have independently reviewed results of EKG, labs as part of medical decision making. I have ordered the following tests:  Orders Placed This Encounter  Procedures   CT CARDIAC SCORING (SELF PAY ONLY)    Standing Status:   Future    Standing Expiration Date:   06/01/2024    Order Specific Question:   Preferred imaging location?    Answer:   External   EKG 12-Lead   PCV ECHOCARDIOGRAM COMPLETE    Standing Status:   Future    Standing Expiration Date:   06/01/2024   I have not made medications changes at today's encounter as noted above.  FINAL MEDICATION LIST END OF ENCOUNTER: No orders of the defined types were placed in this encounter.   Medications Discontinued During This Encounter  Medication Reason   Nebivolol HCl (BYSTOLIC) 20 MG TABS      Current Outpatient Medications:    amLODipine-olmesartan (AZOR)  10-40 MG per tablet, Take 1 tablet by mouth daily.  , Disp: , Rfl:    cholecalciferol (VITAMIN D3) 25 MCG (1000 UNIT) tablet, Take 1,000 Units by mouth daily., Disp: , Rfl:    cloNIDine (CATAPRES) 0.2 MG tablet, Take 0.2 mg by mouth 2 (two) times daily.  , Disp: , Rfl:    fenofibrate 160 MG tablet, Take 160 mg by mouth daily., Disp: , Rfl:    fexofenadine (ALLEGRA) 180 MG tablet, Take 180 mg by mouth daily.  , Disp: , Rfl:    gabapentin (NEURONTIN) 300 MG capsule, Take 300 mg by mouth 2 (two) times daily., Disp: , Rfl:    metoprolol succinate (TOPROL-XL) 25 MG 24 hr tablet, Take 25 mg by mouth daily., Disp: , Rfl:    montelukast (SINGULAIR) 10 MG tablet, Take 10 mg by mouth daily.  , Disp: , Rfl:    Multiple Vitamin (MULTIVITAMIN) capsule, Take 1 capsule by mouth daily., Disp: , Rfl:    Omega-3 Fatty Acids (FISH OIL) 1000 MG CAPS, Take by mouth., Disp: , Rfl:    ondansetron (ZOFRAN ODT) 4 MG disintegrating tablet, 4mg  ODT q4 hours prn nausea/vomit, Disp: 30 tablet, Rfl: 0   traMADol (ULTRAM) 50 MG tablet, Take 50 mg by mouth 2 (two) times daily as needed., Disp: , Rfl:   Orders Placed This Encounter  Procedures   CT CARDIAC SCORING (SELF PAY ONLY)   EKG 12-Lead   PCV ECHOCARDIOGRAM COMPLETE    There are no Patient Instructions on file for this visit.   --Continue cardiac medications as reconciled in final medication list. --Return in about 6 weeks (around 07/14/2023) for Follow up, Review test results. or sooner if needed. --Continue follow-up with your primary care physician regarding the management of your other chronic comorbid conditions.  Patient's questions and concerns were addressed to his satisfaction. He voices understanding of the instructions provided during this encounter.   This note was created using a voice recognition software as a result there may be grammatical errors inadvertently enclosed that do not reflect the nature of this encounter. Every attempt is made to  correct such errors.  Tessa Lerner, [place], [facility]  Pager:  [phone] Office: [phone]

## 2023-06-08 ENCOUNTER — Ambulatory Visit (HOSPITAL_COMMUNITY): Payer: 59

## 2023-06-23 ENCOUNTER — Other Ambulatory Visit: Payer: Medicare HMO

## 2023-06-30 ENCOUNTER — Ambulatory Visit: Payer: Medicare HMO | Admitting: Cardiology

## 2023-07-24 IMAGING — CT CT ABD-PELV W/ CM
2 of 5 series · 16 of 46 positions shown, 18 images · IV contrast (omnipaque)
Comparison: None.

CLINICAL DATA: RLQ abdominal pain

EXAM:
CT ABDOMEN AND PELVIS WITH CONTRAST
TECHNIQUE: Multidetector CT imaging of the abdomen and pelvis was performed
using the standard protocol following bolus administration of
intravenous contrast.
CONTRAST:  100mL OMNIPAQUE IOHEXOL 350 MG/ML SOLN

[Series 3: abd/ pelvis 5.0 i30f 2 · axial · 0.88mm/px · z∈[+730,+1215]mm · 13 of 111 slices shown, 15 images]
[im 7/111  soft-tissue]
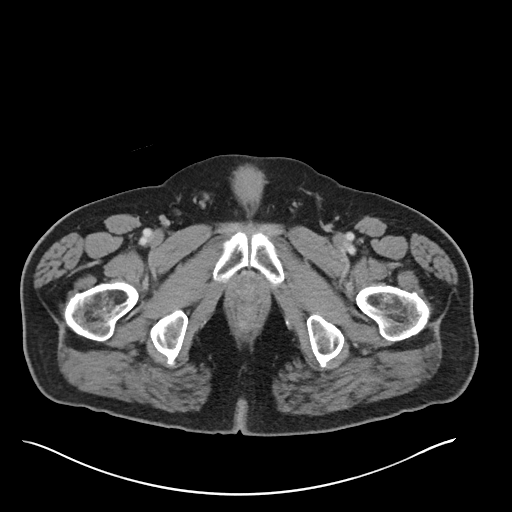
[im 7/111  bone]
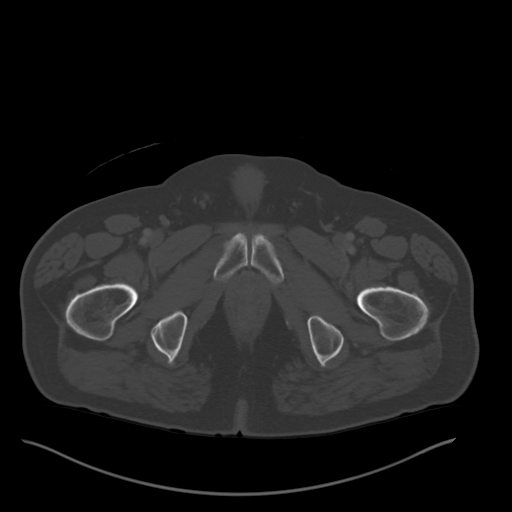
[im 13/111  soft-tissue]
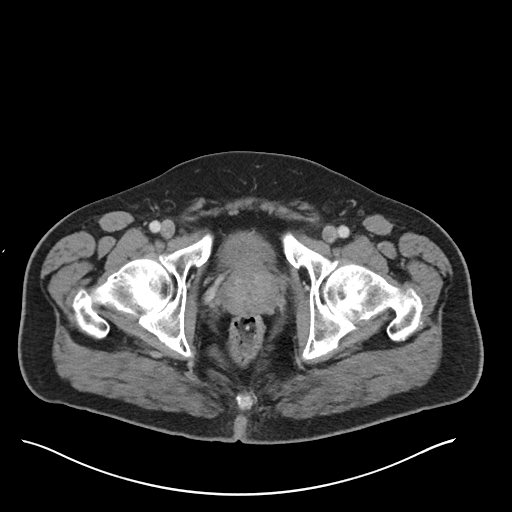
[im 25/111  soft-tissue]
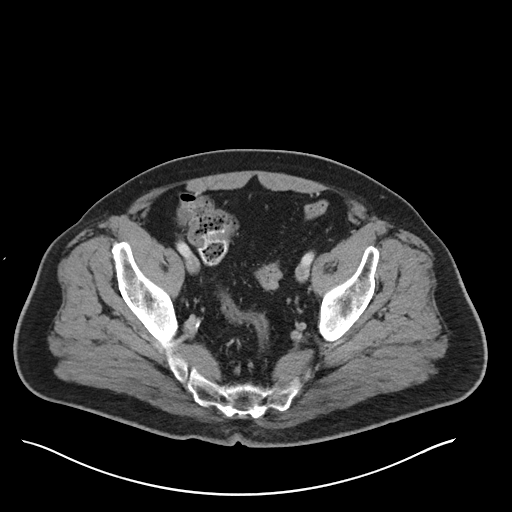
[im 31/111  soft-tissue]
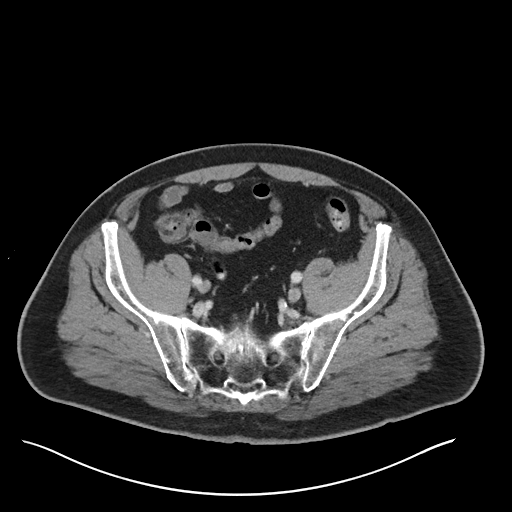
[im 37/111  soft-tissue]
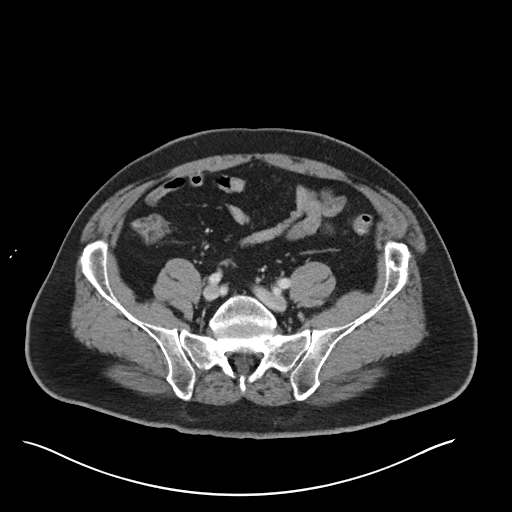
[im 49/111  soft-tissue]
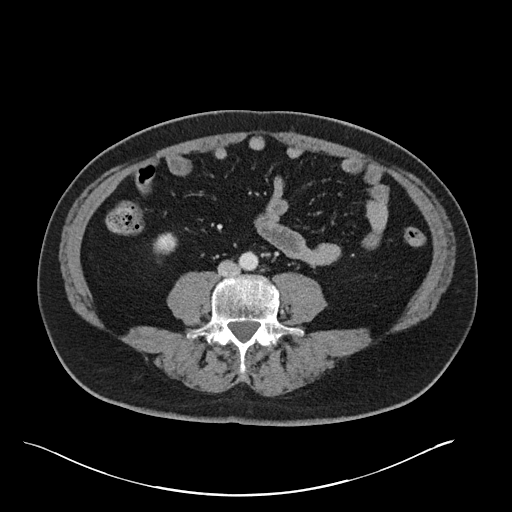
[im 56/111  soft-tissue]
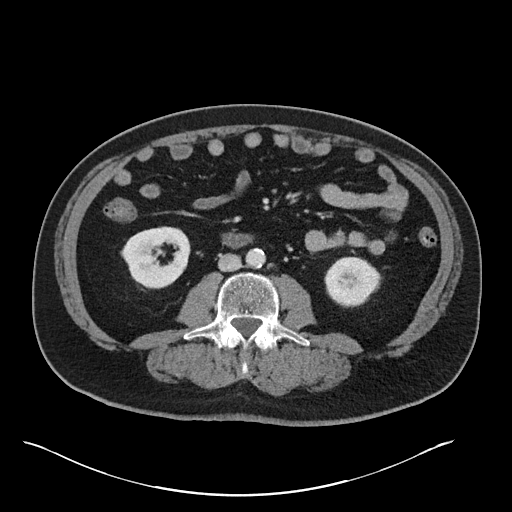
[im 62/111  soft-tissue]
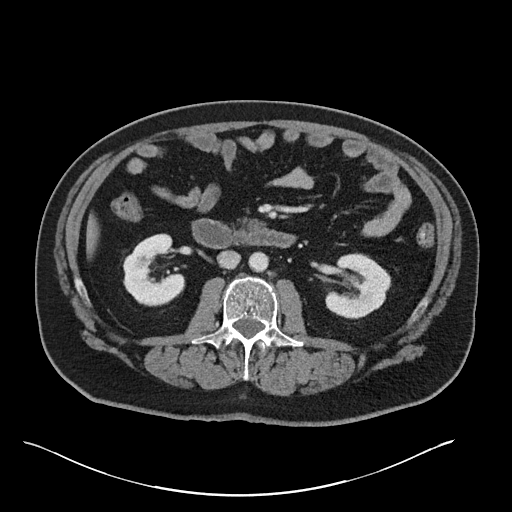
[im 74/111  soft-tissue]
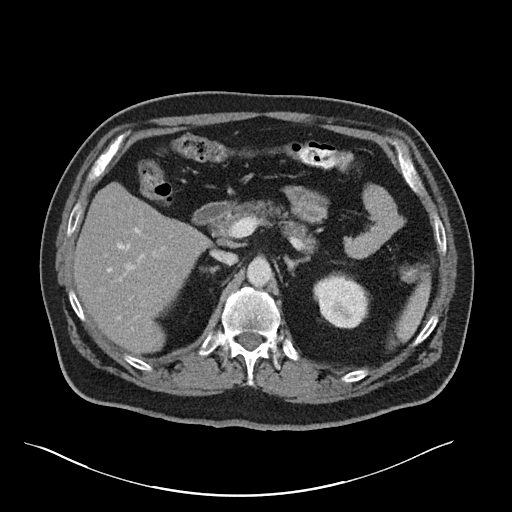
[im 74/111  bone]
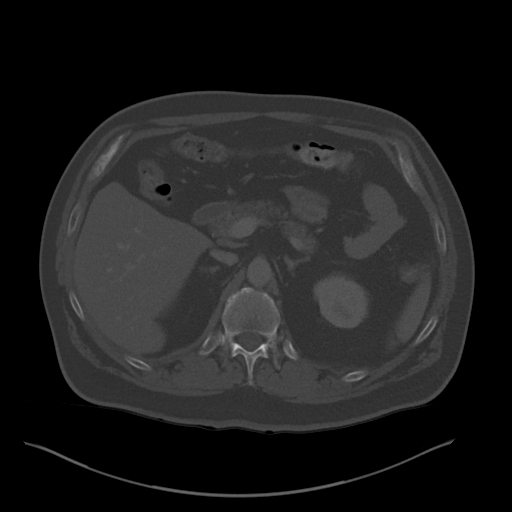
[im 80/111  soft-tissue]
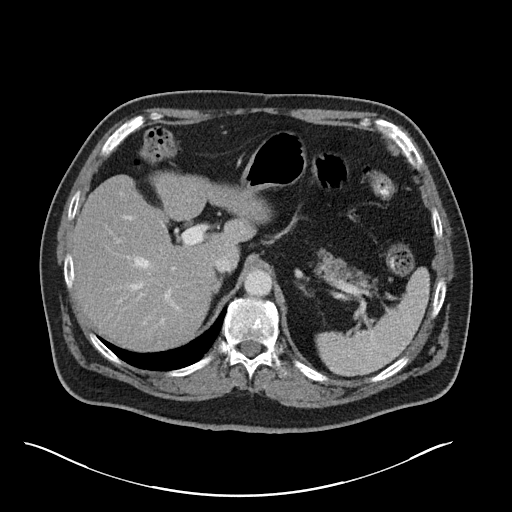
[im 86/111  soft-tissue]
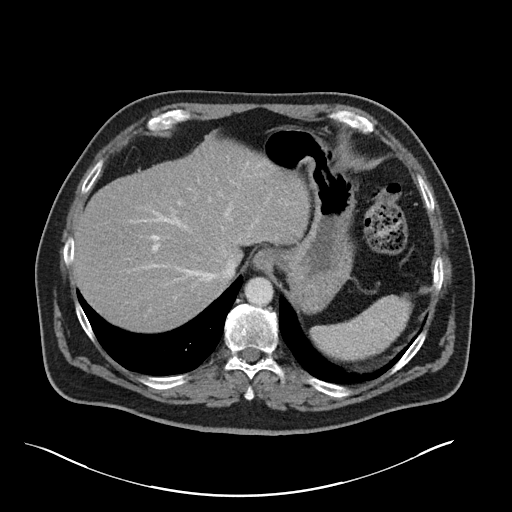
[im 98/111  soft-tissue]
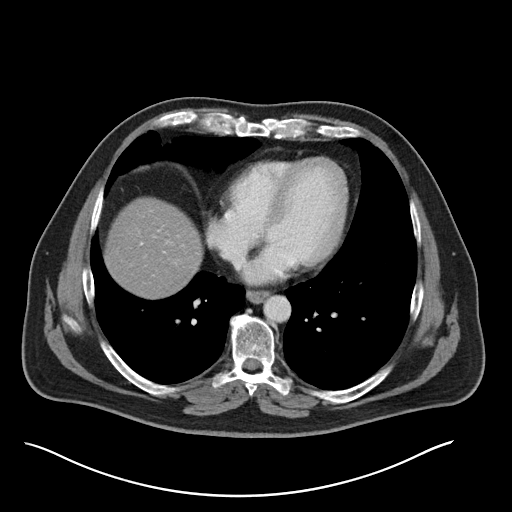
[im 104/111  soft-tissue]
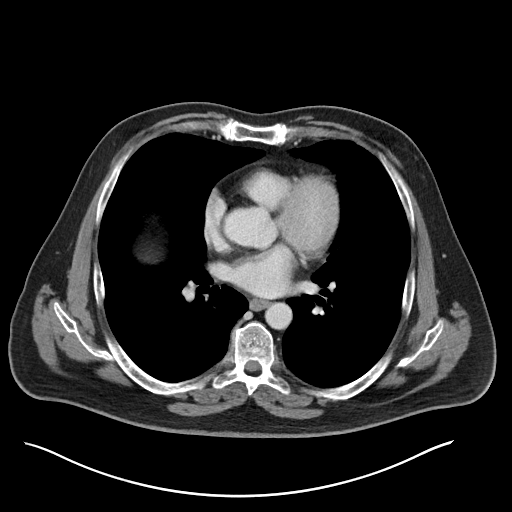

[Series 6: coronal soft tissue · coronal · 0.85mm/px · 3 of 110 slices shown]
[im 37/110  soft-tissue]
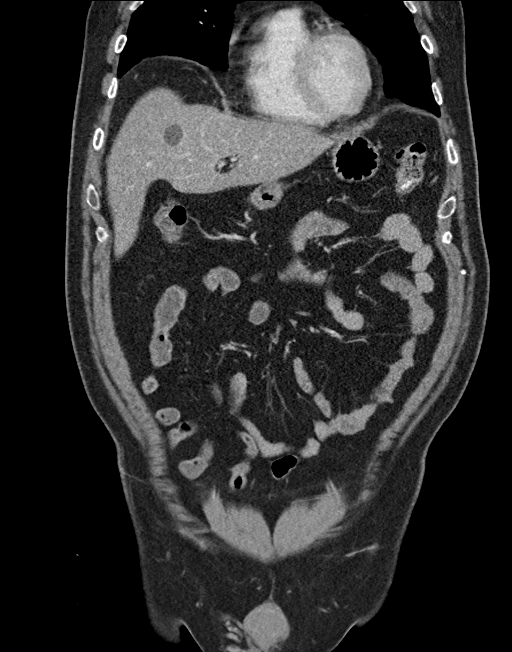
[im 49/110  soft-tissue]
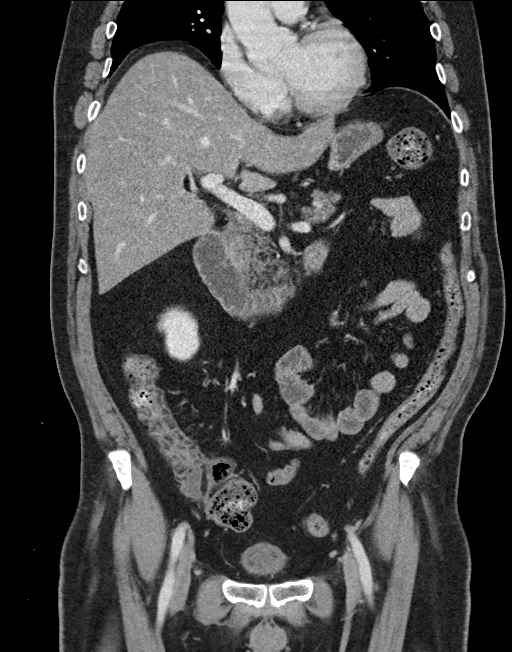
[im 61/110  soft-tissue]
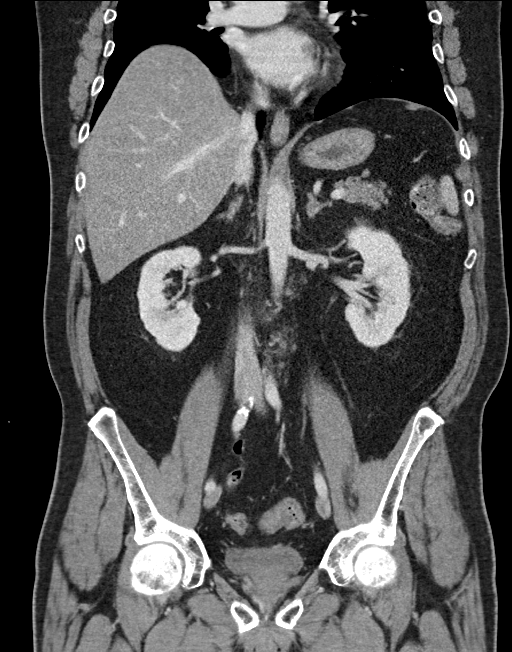

[16 of 46 positions shown; findings below may reference images not displayed]

FINDINGS: Lower chest: No acute abnormality.  Gynecomastia, mild

Hepatobiliary: Hepatic steatosis. There is a 22 mm RIGHT hepatic
cyst. Status post cholecystectomy. No intrahepatic or extrahepatic
biliary ductal dilation. Portal vein is patent.

Pancreas: Unremarkable. No pancreatic ductal dilatation or
surrounding inflammatory changes.

Spleen: Normal in size without focal abnormality.

Adrenals/Urinary Tract: Adrenal glands are unremarkable. No
hydronephrosis. Subcentimeter hypodense lesions are too small to
accurately characterize. No obstructing nephrolithiasis. Bladder is
decompressed.

Stomach/Bowel: Stomach is within normal limits. Appendix appears
normal. No evidence of bowel wall thickening, distention, or acute
inflammatory changes.

Vascular/Lymphatic: Atherosclerotic calcifications of the aorta. No
suspicious lymphadenopathy.

Reproductive: Prostate is present.

Other: No abdominal wall hernia or abnormality. No abdominopelvic
ascites.

Musculoskeletal: No acute or significant osseous findings.
IMPRESSION: 1. No CT etiology for acute abdominal pain identified.
2. Hepatic steatosis.

Aortic Atherosclerosis ([id number].O).

## 2023-11-23 DIAGNOSIS — E559 Vitamin D deficiency, unspecified: Secondary | ICD-10-CM | POA: Diagnosis not present

## 2023-11-23 DIAGNOSIS — E782 Mixed hyperlipidemia: Secondary | ICD-10-CM | POA: Diagnosis not present

## 2023-11-23 DIAGNOSIS — J309 Allergic rhinitis, unspecified: Secondary | ICD-10-CM | POA: Diagnosis not present

## 2023-11-23 DIAGNOSIS — L719 Rosacea, unspecified: Secondary | ICD-10-CM | POA: Diagnosis not present

## 2023-11-23 DIAGNOSIS — M797 Fibromyalgia: Secondary | ICD-10-CM | POA: Diagnosis not present

## 2023-11-23 DIAGNOSIS — I10 Essential (primary) hypertension: Secondary | ICD-10-CM | POA: Diagnosis not present

## 2024-06-02 DIAGNOSIS — F321 Major depressive disorder, single episode, moderate: Secondary | ICD-10-CM | POA: Diagnosis not present

## 2024-06-02 DIAGNOSIS — I10 Essential (primary) hypertension: Secondary | ICD-10-CM | POA: Diagnosis not present

## 2024-06-02 DIAGNOSIS — J309 Allergic rhinitis, unspecified: Secondary | ICD-10-CM | POA: Diagnosis not present

## 2024-06-02 DIAGNOSIS — Z125 Encounter for screening for malignant neoplasm of prostate: Secondary | ICD-10-CM | POA: Diagnosis not present

## 2024-06-02 DIAGNOSIS — L719 Rosacea, unspecified: Secondary | ICD-10-CM | POA: Diagnosis not present

## 2024-06-02 DIAGNOSIS — M797 Fibromyalgia: Secondary | ICD-10-CM | POA: Diagnosis not present

## 2024-06-02 DIAGNOSIS — E782 Mixed hyperlipidemia: Secondary | ICD-10-CM | POA: Diagnosis not present

## 2024-06-02 DIAGNOSIS — E559 Vitamin D deficiency, unspecified: Secondary | ICD-10-CM | POA: Diagnosis not present

## 2024-06-02 DIAGNOSIS — Z0000 Encounter for general adult medical examination without abnormal findings: Secondary | ICD-10-CM | POA: Diagnosis not present

## 2024-06-02 DIAGNOSIS — Z1211 Encounter for screening for malignant neoplasm of colon: Secondary | ICD-10-CM | POA: Diagnosis not present
